# Patient Record
Sex: Male | Born: 1985 | Race: White | Hispanic: No | Marital: Single | Smoking: Never smoker
Health system: Southern US, Community
[De-identification: ages and names within clinical notes are randomized; demographics above are authoritative.]

## PROBLEM LIST (undated history)

## (undated) DIAGNOSIS — B001 Herpesviral vesicular dermatitis: Secondary | ICD-10-CM

## (undated) HISTORY — PX: HERNIA REPAIR: SHX51

## (undated) HISTORY — DX: Herpesviral vesicular dermatitis: B00.1

---
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
## 2007-05-11 HISTORY — PX: SCAPHOID FRACTURE SURGERY: SHX769

## 2012-08-28 ENCOUNTER — Encounter (INDEPENDENT_AMBULATORY_CARE_PROVIDER_SITE_OTHER): Payer: Self-pay | Admitting: General Surgery

## 2012-08-30 ENCOUNTER — Encounter (INDEPENDENT_AMBULATORY_CARE_PROVIDER_SITE_OTHER): Payer: Self-pay | Admitting: General Surgery

## 2012-08-30 ENCOUNTER — Ambulatory Visit (INDEPENDENT_AMBULATORY_CARE_PROVIDER_SITE_OTHER): Payer: BC Managed Care – PPO | Admitting: General Surgery

## 2012-08-30 VITALS — BP 116/70 | HR 60 | Resp 14 | Ht 72.0 in | Wt 185.0 lb

## 2012-08-30 DIAGNOSIS — K4090 Unilateral inguinal hernia, without obstruction or gangrene, not specified as recurrent: Secondary | ICD-10-CM

## 2012-08-30 DIAGNOSIS — R1032 Left lower quadrant pain: Secondary | ICD-10-CM

## 2012-08-30 NOTE — Progress Notes (Signed)
Patient ID: Michael Nichols, male   DOB: 01-30-1986, 27 y.o.   MRN: [id number]  Chief Complaint  Patient presents with  . Inguinal Hernia    right    HPI Michael Nichols is a 26 y.o. male.   HPI 27 yo WM referred by Dr Wynelle Link for evaluation of RIH. Patient states he has noticed an intermittent problem in his right groin for about a year and a half. However about 3 weeks ago he started to notice a bulge and lump in his groin particularly after doing yoga. The bulge will come and go. It is not noticeable while lying flat. However after standing for prolonged period of time or after doing exercise it'll be larger and more noticeable. It doesn't cause him any pain or discomfort. He denies any right inguinal numbness, burning or tingling. With respect to his left groin in the medial aspect of his inguinal area he complains of discomfort when sitting upright for prolonged period time as well as when getting up from a seated position. That discomfort is sharp and uncomfortable. He denies any dysuria. 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  History reviewed. No pertinent past medical history.  Past Surgical History  Procedure Laterality Date  . Scaphoid fracture surgery  2009    Family History  Problem Relation Age of Onset  . Breast cancer Mother   . Diabetes Father     Social History History  Substance Use Topics  . Smoking status: Never Smoker   . Smokeless tobacco: Not on file  . Alcohol Use: 1.8 - 2.4 oz/week    3-4 Cans of beer per week    Allergies  Allergen Reactions  . Cefaclor     No current outpatient prescriptions on file.   No current facility-administered medications for this visit.    Review of Systems Review of Systems  Constitutional: Negative for fever, chills, appetite change and unexpected weight change.  HENT: Negative for congestion and trouble swallowing.   Eyes: Negative for visual disturbance.  Respiratory: Negative for chest tightness  and shortness of breath.   Cardiovascular: Negative for chest pain and leg swelling.       No PND, no orthopnea, no DOE  Gastrointestinal:       See HPI  Genitourinary: Negative for dysuria and hematuria.  Musculoskeletal: Negative.   Skin: Negative for rash.  Neurological: Negative for seizures and speech difficulty.  Hematological: Does not bruise/bleed easily.  Psychiatric/Behavioral: Negative for behavioral problems and confusion.    Blood pressure 116/70, pulse 60, resp. rate 14, height 6' (1.829 m), weight 185 lb (83.915 kg).  Physical Exam Physical Exam  Vitals reviewed. Constitutional: He is oriented to person, place, and time. He appears well-developed and well-nourished. No distress.  Fit young man  HENT:  Head: Normocephalic and atraumatic.  Right Ear: External ear normal.  Left Ear: External ear normal.  Eyes: Conjunctivae are normal. No scleral icterus.  Neck: Neck supple. No tracheal deviation present. No thyromegaly present.  Cardiovascular: Normal rate, regular rhythm and normal heart sounds.   Pulmonary/Chest: Effort normal and breath sounds normal. No respiratory distress. He has no wheezes.  Abdominal: Soft. He exhibits no distension. There is no tenderness. There is no rebound and no guarding. A hernia is present. Hernia confirmed positive in the right inguinal area.  Genitourinary: Testes normal and penis normal.    Right testis shows no tenderness. Left testis shows no tenderness.  RIH/bulge on supine and standing exam.  Reducible.  Left groin - no obvious bulge or enlarged ring on supine or standing exam with valsalva  Musculoskeletal: He exhibits no edema and no tenderness.  Lymphadenopathy:    He has no cervical adenopathy.       Right: No inguinal adenopathy present.       Left: No inguinal adenopathy present.  Neurological: He is alert and oriented to person, place, and time.  Skin: Skin is warm and dry. No rash noted. He is not diaphoretic. No  erythema. No pallor.  Psychiatric: He has a normal mood and affect. His behavior is normal. Judgment and thought content normal.    Data Reviewed Dr Chase Caller note 4/9 & 4/4   Assessment    Right inguinal hernia Left inferior inguinal pain     Plan    We discussed the etiology of inguinal hernias. We discussed the signs & symptoms of incarceration & strangulation.  We discussed non-operative and operative management. We discussed both open as well as laparoscopic repairs as well as the pros and cons of each.  I do not appreciate a definitive hernia on the left side. However he does have symptoms of left groin pain. We discussed that with the laparoscopic approach we could evaluate the left side and repair a hernia if we found one. If we do not find a hernia on the left side during surgery I believe the physical restrictions postoperatively will help to determine whether or not it's more consistent with inguinal strain  The patient has elected to Proceed with laparoscopic repair of right inguinal hernia with mesh and possible left inguinal hernia repair with mesh   I described the procedure in detail.  The patient was given educational material. We discussed the risks and benefits including but not limited to bleeding, infection, chronic inguinal pain, nerve entrapment, hernia recurrence, mesh complications, hematoma formation, urinary retention, injury to the testicles, numbness in the groin, blood clots, injury to the surrounding structures, and anesthesia risk. We also discussed the typical post operative recovery course, including no heavy lifting for 4-6 weeks. I explained that the likelihood of improvement of their symptoms is good  Mary Sella. Andrey Campanile, MD, FACS General, Bariatric, & Minimally Invasive Surgery [facility], [place]          [facility] M 08/30/2012, 11:11 AM

## 2012-08-30 NOTE — Patient Instructions (Signed)
Hernia Repair with Laparoscope A hernia occurs when an internal organ pushes out through a weak spot in the belly (abdominal) wall muscles. Hernias most commonly occur in the groin and around the navel. Hernias can also occur through a cut by the surgeon (incision) after an abdominal operation. A hernia may be caused by:  Lifting heavy objects.  Prolonged coughing.  Straining to move your bowels. Hernias can often be pushed back into place (reduced). Most hernias tend to get worse over time. Problems occur when abdominal contents get stuck in the opening and the blood supply is blocked or impaired (incarcerated hernia). Because of these risks, you require surgery to repair the hernia. Your hernia will be repaired using a laparoscope. Laparoscopic surgery is a type of minimally invasive surgery. It does not involve making a typical surgical cut (incision) in the skin. A laparoscope is a telescope-like rod and lens system. It is usually connected to a video camera and a light source so your caregiver can clearly see the operative area. The instruments are inserted through  to  inch (5 mm or 10 mm) openings in the skin at specific locations. A working and viewing space is created by blowing a small amount of carbon dioxide gas into the abdominal cavity. The abdomen is essentially blown up like a balloon (insufflated). This elevates the abdominal wall above the internal organs like a dome. The carbon dioxide gas is common to the human body and can be absorbed by tissue and removed by the respiratory system. Once the repair is completed, the small incisions will be closed with either stitches (sutures) or staples (just like a paper stapler only this staple holds the skin together). LET YOUR CAREGIVERS KNOW ABOUT:  Allergies.  Medications taken including herbs, eye drops, over the counter medications, and creams.  Use of steroids (by mouth or creams).  Previous problems with anesthetics or  Novocaine.  Possibility of pregnancy, if this applies.  History of blood clots (thrombophlebitis).  History of bleeding or blood problems.  Previous surgery.  Other health problems. BEFORE THE PROCEDURE  Laparoscopy can be done either in a hospital or out-patient clinic. You may be given a mild sedative to help you relax before the procedure. Once in the operating room, you will be given a general anesthesia to make you sleep (unless you and your caregiver choose a different anesthetic).  AFTER THE PROCEDURE  After the procedure you will be watched in a recovery area. Depending on what type of hernia was repaired, you might be admitted to the hospital or you might go home the same day. With this procedure you may have less pain and scarring. This usually results in a quicker recovery and less risk of infection. HOME CARE INSTRUCTIONS   Bed rest is not required. You may continue your normal activities but avoid heavy lifting (more than 10 pounds) or straining.  Cough gently. If you are a smoker it is best to stop, as even the best hernia repair can break down with the continual strain of coughing.  Avoid driving until given the OK by your surgeon.  There are no dietary restrictions unless given otherwise.  TAKE ALL MEDICATIONS AS DIRECTED.  Only take over-the-counter or prescription medicines for pain, discomfort, or fever as directed by your caregiver. SEEK MEDICAL CARE IF:   There is increasing abdominal pain or pain in your incisions.  There is more bleeding from incisions, other than minimal spotting.  You feel light headed or faint.  You   develop an unexplained fever, chills, and/or an oral temperature above 102 F (38.9 C).  You have redness, swelling, or increasing pain in the wound.  Pus coming from wound.  A foul smell coming from the wound or dressings. SEEK IMMEDIATE MEDICAL CARE IF:   You develop a rash.  You have difficulty breathing.  You have any  allergic problems. MAKE SURE YOU:   Understand these instructions.  Will watch your condition.  Will get help right away if you are not doing well or get worse. Document Released: 04/26/2005 Document Revised: 07/19/2011 Document Reviewed: 03/26/2009 Exit[employer] Patient Information 2013 [employer].  

## 2012-09-04 ENCOUNTER — Encounter (INDEPENDENT_AMBULATORY_CARE_PROVIDER_SITE_OTHER): Payer: Self-pay

## 2012-09-12 ENCOUNTER — Other Ambulatory Visit (INDEPENDENT_AMBULATORY_CARE_PROVIDER_SITE_OTHER): Payer: Self-pay | Admitting: General Surgery

## 2012-09-12 ENCOUNTER — Telehealth (INDEPENDENT_AMBULATORY_CARE_PROVIDER_SITE_OTHER): Payer: Self-pay

## 2012-09-12 DIAGNOSIS — K4020 Bilateral inguinal hernia, without obstruction or gangrene, not specified as recurrent: Secondary | ICD-10-CM

## 2012-09-12 NOTE — Telephone Encounter (Signed)
Returned pt's call and notified him that we don't give antibiotics after a hernia repair. I advised the pt that Dr Andrey Campanile gave him an antibiotic thru his IV in the holding area before his surgery. The pt understands.

## 2012-09-12 NOTE — Telephone Encounter (Signed)
Patient discharge today . He is concerned  Because DR. Wilson did not prescribe him a ABT. Does Dr. Andrey Campanile  want patient to start a ABT?  He uses [facility] on [address] / [address],. Please Advise

## 2012-09-12 NOTE — Telephone Encounter (Signed)
No role for prophylactic abx. Remind pt no lifting, pushing. Or pulling anything greater than 15 pounds for 6 weeks

## 2012-09-14 ENCOUNTER — Telehealth (INDEPENDENT_AMBULATORY_CARE_PROVIDER_SITE_OTHER): Payer: Self-pay

## 2012-09-14 ENCOUNTER — Ambulatory Visit (INDEPENDENT_AMBULATORY_CARE_PROVIDER_SITE_OTHER): Payer: BC Managed Care – PPO | Admitting: General Surgery

## 2012-09-14 ENCOUNTER — Encounter (INDEPENDENT_AMBULATORY_CARE_PROVIDER_SITE_OTHER): Payer: Self-pay | Admitting: General Surgery

## 2012-09-14 VITALS — BP 102/60 | HR 60 | Temp 97.2°F | Resp 16 | Ht 72.0 in | Wt 186.8 lb

## 2012-09-14 DIAGNOSIS — T8140XA Infection following a procedure, unspecified, initial encounter: Secondary | ICD-10-CM

## 2012-09-14 MED ORDER — DOXYCYCLINE HYCLATE 100 MG PO CAPS: 100.0000 mg | ORAL_CAPSULE | Freq: Two times a day (BID) | ORAL | Status: DC

## 2012-09-14 NOTE — Patient Instructions (Signed)
Pick up antibiotic - take as directed Call if you develop Temp>101, worsening redness As the inflammation gets better, the discomfort at the umbilicus should improve

## 2012-09-14 NOTE — Telephone Encounter (Signed)
Due to symptoms, it sounds like patient needs seen. Appt made for this afternoon with Dr Andrey Campanile.

## 2012-09-14 NOTE — Telephone Encounter (Signed)
The pt called s/p bilateral hernia repair with mesh on 09/12/2012.   He has some bruising and redness below the belly button.  It is swollen and tender to touch.  It feels warm.  There is no drainage and he has no fever.  Please advise.

## 2012-09-14 NOTE — Progress Notes (Signed)
Subjective:     Patient ID: Michael Nichols, male   DOB: 01-Jan-1986, 27 y.o.   MRN: [id number]  HPI  27 year old Caucasian male status post laparoscopic repair of bilateral inguinal hernias 2 days ago comes in for her discomfort and redness around his umbilicus. He states that it started this morning. He denies any fevers or chills. Denies any nausea or vomiting. He reports a bowel movement today. He reports a good appetite. He denies any numbness or burning in his groins. He denies any dysuria. Review of Systems     Objective:   Physical Exam BP 102/60  Pulse 60  Temp(Src) 97.2 F (36.2 C) (Temporal)  Resp 16  Ht 6' (1.829 m)  Wt 186 lb 12.8 oz (84.732 kg)  BMI 25.33 kg/m2 Alert, no apparent distress Abdomen-soft, nondistended. 3 trocar incisions. Around the umbilicus there is some blanching erythema primarily inferior to the umbilical incision. This area is a little puffy however there is no sign of fluctuance. It is approximately 6 cm x 4 cm it was outlined. No evidence of hernia recurrence. Some mild bruising in the right groin.     Assessment:     Postoperative cellulitis     Plan:     It appears he has developed some cellulitis around his umbilical incision. He was given a prescription for doxycycline for 10 days. He was instructed to call for fever or worsening erythema. I also instructed him to call with any additional questions or concerns. I explained that the soreness around umbilicus should get better once inflammation improves  Mary Sella. Andrey Campanile, MD, FACS General, Bariatric, & Minimally Invasive Surgery [facility], [place]

## 2012-09-19 ENCOUNTER — Telehealth (INDEPENDENT_AMBULATORY_CARE_PROVIDER_SITE_OTHER): Payer: Self-pay | Admitting: General Surgery

## 2012-09-19 NOTE — Telephone Encounter (Signed)
LM to see if cellulitis was improving.

## 2012-09-27 ENCOUNTER — Encounter (INDEPENDENT_AMBULATORY_CARE_PROVIDER_SITE_OTHER): Payer: BC Managed Care – PPO | Admitting: General Surgery

## 2012-09-28 ENCOUNTER — Encounter (INDEPENDENT_AMBULATORY_CARE_PROVIDER_SITE_OTHER): Payer: Self-pay | Admitting: General Surgery

## 2012-09-28 ENCOUNTER — Ambulatory Visit (INDEPENDENT_AMBULATORY_CARE_PROVIDER_SITE_OTHER): Payer: BC Managed Care – PPO | Admitting: General Surgery

## 2012-09-28 VITALS — BP 120/76 | HR 56 | Temp 97.9°F | Resp 16 | Ht 72.0 in | Wt 177.0 lb

## 2012-09-28 DIAGNOSIS — Z09 Encounter for follow-up examination after completed treatment for conditions other than malignant neoplasm: Secondary | ICD-10-CM

## 2012-09-28 NOTE — Progress Notes (Signed)
Subjective:     Patient ID: Michael Nichols, male   DOB: 08-30-1985, 27 y.o.   MRN: [id number]  HPI 27 year old Caucasian male comes in for followup after undergoing laparoscopic repair of a left indirect inguinal hernia with mesh as well as a laparoscopic repair of a right indirect inguinal hernia mesh along with hydrocele on 09/12/2012. I saw him 2 days after surgery when he developed some mild cellulitis around his umbilical incision. He was placed on doxycycline. He states his cellulitis resolved. He states that he is doing great. His only complaint is some mild testicular discomfort on the right. He states it only bothers him if he grabs his testicle. He denies any fever, chills, nausea, vomiting, diarrhea or constipation. He reports a good appetite. Denies any dysuria.  Review of Systems     Objective:   Physical Exam BP 120/76  Pulse 56  Temp(Src) 97.9 F (36.6 C) (Temporal)  Resp 16  Ht 6' (1.829 m)  Wt 177 lb (80.287 kg)  BMI 24 kg/m2 Alert, no apparent distress Abdomen-soft, nontender, nondistended. Well-healed trocar incisions with some residual Dermabond. No cellulitis GU-both testicles down. They are symmetric. No inguinal hematoma or seroma. No evidence of hernia recurrence. Epididymis appears normal    Assessment:     Status post laparoscopic repair of left indirect inguinal hernia with mesh as well as laparoscopic repair of right indirect inguinal hernia with mesh as well as repair of a right hydrocele     Plan:     Overall I think he is doing great. I explained to him that the right testicular discomfort should improve with time. I explained that we had to do more work on the right side and that I am not surprised he has some right-sided testicular discomfort. I told him he did do some very light cardio activities. He was reminded that he should not do any heavy lifting, pushing or pulling for another 4 weeks. I will see him back in about 6 weeks to make sure the right  testicular discomfort has resolved.  Mary Sella. Andrey Campanile, MD, FACS General, Bariatric, & Minimally Invasive Surgery [facility], [place]

## 2012-09-28 NOTE — Patient Instructions (Signed)
Can do very light cardio now No lifting, pushing, or pulling for 4 weeks anything greater than 15 pounds

## 2012-11-16 ENCOUNTER — Telehealth (INDEPENDENT_AMBULATORY_CARE_PROVIDER_SITE_OTHER): Payer: Self-pay | Admitting: General Surgery

## 2012-11-16 NOTE — Telephone Encounter (Signed)
Message copied by June Leap on Thu Nov 16, 2012  1:50 PM ------      Message from: Louie Casa      Created: Wed Nov 15, 2012 10:19 AM      Regarding: Dr. Corliss Parish MRI      Contact: [phone]       Patient had bilateral inguinal hernias repair but he has too much pain and wants to know if Doctor can order a MRI, Please call him.      Thank you. ------

## 2012-11-16 NOTE — Telephone Encounter (Signed)
Called to speak with patient about pain and the MRI he is wanting..he stated the pain is the same type of pain that he has been having since the surgery 09/12/12 but he just wanted a mental view for his own piece of mind.Marland KitchenMarland KitchenI told patient that EW would have to okay that order and since he had an appt tomorrow 7/11 they could just talk about it then..patient was in agreement

## 2012-11-17 ENCOUNTER — Ambulatory Visit (INDEPENDENT_AMBULATORY_CARE_PROVIDER_SITE_OTHER): Payer: BC Managed Care – PPO | Admitting: General Surgery

## 2012-11-17 ENCOUNTER — Encounter (INDEPENDENT_AMBULATORY_CARE_PROVIDER_SITE_OTHER): Payer: Self-pay | Admitting: General Surgery

## 2012-11-17 VITALS — BP 120/84 | HR 64 | Temp 97.1°F | Resp 14 | Ht 72.0 in | Wt 182.0 lb

## 2012-11-17 DIAGNOSIS — Z09 Encounter for follow-up examination after completed treatment for conditions other than malignant neoplasm: Secondary | ICD-10-CM

## 2012-11-17 NOTE — Patient Instructions (Signed)
Can resume full activities If certain activities aggravate it - avoid those

## 2012-11-17 NOTE — Progress Notes (Signed)
Subjective:     Patient ID: Michael Nichols, male   DOB: 10-Nov-1985, 27 y.o.   MRN: [id number]  HPI 27 year old Caucasian male comes in for followup after undergoing laparoscopic repair of a left indirect inguinal hernia with mesh as well as a laparoscopic repair of a right indirect inguinal hernia mesh along with hydrocele on 09/12/2012. I last saw him in late May. He states that he is doing great. He has 2 issues/concerns. His only complaint is some mild testicular discomfort on the right which is now getting better. He states it only bothers him if he grabs his testicle. His other issue is some persistent intermittent burning pain in his right groin. There is no pattern to it. It will last for a few minutes at a time. When it does occur it'll resolve if he lays down. Swimming, walking, running, doesn't trigger it. Doing pushups doesn't affect either. He states that he twists or turns a certain way it may cause point tenderness in his right inguinal area.He denies any fever, chills, nausea, vomiting, diarrhea or constipation. He reports a good appetite. Denies any dysuria.  Review of Systems     Objective:   Physical Exam  Abdominal:     BP 120/84  Pulse 64  Temp(Src) 97.1 F (36.2 C) (Temporal)  Resp 14  Ht 6' (1.829 m)  Wt 182 lb (82.555 kg)  BMI 24.68 kg/m2  SpO2 98% Alert, no apparent distress Abdomen-soft, nontender, nondistended. Well-healed trocar incisions No cellulitis GU-both testicles down. They are symmetric. No inguinal hematoma or seroma. No evidence of hernia recurrence. Epididymis appears normal    Assessment:     Status post laparoscopic repair of left indirect inguinal hernia with mesh as well as laparoscopic repair of right indirect inguinal hernia with mesh as well as repair of a right hydrocele     Plan:     There is no evidence of hernia recurrence. There is no bulge on Valsalva. There is no enlarged inguinal ring. It sounds more like nerve  discomfort/irritation. He is more than 2 months out from surgery. At this point I told him he could continue full activities however any activity that he notices triggers the discomfort he should avoid that activity. I tried to reassure him to give it additional time. We did discuss medical treatment with neuropathic medication like Elavil or Lyrica or Neurontin however he declined since it only bothers him on occasion. Followup 3 months  Mary Sella. Andrey Campanile, MD, FACS General, Bariatric, & Minimally Invasive Surgery [facility], [place]

## 2013-01-11 ENCOUNTER — Telehealth (INDEPENDENT_AMBULATORY_CARE_PROVIDER_SITE_OTHER): Payer: Self-pay | Admitting: General Surgery

## 2013-01-11 NOTE — Telephone Encounter (Signed)
Message copied by June Leap on Thu Jan 11, 2013  9:24 AM ------      Message from: Marin Shutter      Created: Wed Jan 10, 2013  1:32 PM      Regarding: Dr. Gracelyn Nurse - calling [facility] for Oct.  Pt says he's doing fine, so I took him off the recall list.  Thx ------

## 2013-01-11 NOTE — Telephone Encounter (Signed)
See below

## 2019-04-23 ENCOUNTER — Other Ambulatory Visit: Payer: Self-pay

## 2019-04-23 DIAGNOSIS — Z20822 Contact with and (suspected) exposure to covid-19: Secondary | ICD-10-CM

## 2019-04-24 LAB — NOVEL CORONAVIRUS, NAA: SARS-CoV-2, NAA: NOT DETECTED

## 2019-08-11 ENCOUNTER — Ambulatory Visit: Payer: Self-pay | Attending: Internal Medicine

## 2019-08-11 DIAGNOSIS — Z23 Encounter for immunization: Secondary | ICD-10-CM

## 2019-08-11 NOTE — Progress Notes (Signed)
   Covid-19 Vaccination Clinic  Name:  Tracen Mahler    MRN: [id number] DOB: 1985-07-01  08/11/2019  Mr. Dhanani was observed post Covid-19 immunization for 15 minutes without incident. He was provided with Vaccine Information Sheet and instruction to access the V-Safe system.   Mr. Sturgill was instructed to call 911 with any severe reactions post vaccine: Marland Kitchen Difficulty breathing  . Swelling of face and throat  . A fast heartbeat  . A bad rash all over body  . Dizziness and weakness   Immunizations Administered    Name Date Dose VIS Date Route   Pfizer COVID-19 Vaccine 08/11/2019 12:22 PM 0.3 mL 04/20/2019 Intramuscular   Manufacturer: [employer], [employer]   Lot: [id number]   NDC: [address]-0

## 2019-09-04 ENCOUNTER — Ambulatory Visit: Payer: Self-pay | Attending: Internal Medicine

## 2019-09-04 DIAGNOSIS — Z23 Encounter for immunization: Secondary | ICD-10-CM

## 2019-09-04 NOTE — Progress Notes (Signed)
   Covid-19 Vaccination Clinic  Name:  Michael Nichols    MRN: [id number] DOB: 06-May-1986  09/04/2019  [name] was observed post Covid-19 immunization for 15 minutes without incident. He was provided with Vaccine Information Sheet and instruction to access the V-Safe system.   [name] was instructed to call 911 with any severe reactions post vaccine: Marland Kitchen Difficulty breathing  . Swelling of face and throat  . A fast heartbeat  . A bad rash all over body  . Dizziness and weakness   Immunizations Administered    Name Date Dose VIS Date Route   Pfizer COVID-19 Vaccine 09/04/2019 12:11 PM 0.3 mL 07/04/2018 Intramuscular   Manufacturer: [employer], [employer]   Lot: [id number]   NDC: [address]-6

## 2022-11-09 DIAGNOSIS — C4442 Squamous cell carcinoma of skin of scalp and neck: Secondary | ICD-10-CM | POA: Diagnosis not present

## 2022-11-09 DIAGNOSIS — D225 Melanocytic nevi of trunk: Secondary | ICD-10-CM | POA: Diagnosis not present

## 2022-11-09 DIAGNOSIS — B078 Other viral warts: Secondary | ICD-10-CM | POA: Diagnosis not present

## 2022-11-09 DIAGNOSIS — D492 Neoplasm of unspecified behavior of bone, soft tissue, and skin: Secondary | ICD-10-CM | POA: Diagnosis not present

## 2022-11-09 DIAGNOSIS — R238 Other skin changes: Secondary | ICD-10-CM | POA: Diagnosis not present

## 2022-11-09 DIAGNOSIS — L814 Other melanin hyperpigmentation: Secondary | ICD-10-CM | POA: Diagnosis not present

## 2022-11-09 DIAGNOSIS — L820 Inflamed seborrheic keratosis: Secondary | ICD-10-CM | POA: Diagnosis not present

## 2022-11-09 DIAGNOSIS — L821 Other seborrheic keratosis: Secondary | ICD-10-CM | POA: Diagnosis not present

## 2022-12-03 DIAGNOSIS — B079 Viral wart, unspecified: Secondary | ICD-10-CM | POA: Diagnosis not present

## 2022-12-03 DIAGNOSIS — D408 Neoplasm of uncertain behavior of other specified male genital organs: Secondary | ICD-10-CM | POA: Diagnosis not present

## 2022-12-03 DIAGNOSIS — D074 Carcinoma in situ of penis: Secondary | ICD-10-CM | POA: Diagnosis not present

## 2022-12-31 DIAGNOSIS — D074 Carcinoma in situ of penis: Secondary | ICD-10-CM | POA: Diagnosis not present

## 2023-11-15 DIAGNOSIS — D225 Melanocytic nevi of trunk: Secondary | ICD-10-CM | POA: Diagnosis not present

## 2023-11-15 DIAGNOSIS — Z86007 Personal history of in-situ neoplasm of skin: Secondary | ICD-10-CM | POA: Diagnosis not present

## 2023-11-15 DIAGNOSIS — Z85828 Personal history of other malignant neoplasm of skin: Secondary | ICD-10-CM | POA: Diagnosis not present

## 2023-11-15 DIAGNOSIS — Z08 Encounter for follow-up examination after completed treatment for malignant neoplasm: Secondary | ICD-10-CM | POA: Diagnosis not present

## 2023-11-15 DIAGNOSIS — L821 Other seborrheic keratosis: Secondary | ICD-10-CM | POA: Diagnosis not present

## 2023-11-15 DIAGNOSIS — L814 Other melanin hyperpigmentation: Secondary | ICD-10-CM | POA: Diagnosis not present
